# Patient Record
Sex: Female | Born: 1990 | Race: White | Hispanic: No | Marital: Married
Health system: Southern US, Community
[De-identification: ages and names within clinical notes are randomized; demographics above are authoritative.]

---

## 2020-06-26 ENCOUNTER — Ambulatory Visit
Admission: RE | Admit: 2020-06-26 | Discharge: 2020-06-26 | Disposition: A | Discharge status: Home / Self Care | Payer: Self-pay | Source: Ambulatory Visit | Attending: Oncology | Admitting: Oncology

## 2020-06-26 ENCOUNTER — Other Ambulatory Visit: Payer: Self-pay

## 2020-06-26 ENCOUNTER — Encounter (INDEPENDENT_AMBULATORY_CARE_PROVIDER_SITE_OTHER): Payer: Self-pay

## 2020-06-26 ENCOUNTER — Ambulatory Visit: Payer: Self-pay | Attending: Oncology

## 2020-06-26 VITALS — BP 106/81 | HR 85 | Temp 97.1°F | Ht 67.0 in | Wt 171.0 lb

## 2020-06-26 DIAGNOSIS — N644 Mastodynia: Secondary | ICD-10-CM | POA: Insufficient documentation

## 2020-06-26 NOTE — Progress Notes (Signed)
  Subjective:     Patient ID: Chloe Skinner, female   DOB: 28-Dec-1990, 30 y.o.   MRN: [id number]  HPI   Review of Systems     Objective:   Physical Exam Chest:       Comments: Targeted pain right breast 5 oclock 2 cm. From areola!!!       Assessment:    30 year old with complaint of right breast pain since December, presents for [facility] screening.  Denies pain being related to cycle.  States no possibility of being pregnant.    Patient screened, and meets [facility] eligibility.  Patient does not have insurance, Medicare or Medicaid. Instructed patient on breast self awareness using teach back method. Clinical breast exam does not reveal any mass or lump.  Patient states breast feels like it is swollen at times of pain, and reports darkening of areola at that time.    Plan:     Sent for bilateral diagnostic baseline mammogram, and ultrasound.

## 2020-06-26 NOTE — Progress Notes (Signed)
Radiologist reviewed Birads 1 findings with patient .  To return to clinic as needed, ad begin annual mammograms at age 30.  Copy to [facility].

## 2022-06-05 IMAGING — MG DIGITAL DIAGNOSTIC BILAT W/ TOMO W/ CAD
6 of 12 series · 6 of 36 positions shown · non-contrast
Comparison: None.

CLINICAL DATA: 29-year-old female presenting for baseline mammogram
to evaluate focal intermittent right breast pain with a palpable
lump at 5 o'clock. This has been ongoing since Saturday April, 2020.

EXAM:
DIGITAL DIAGNOSTIC BILATERAL MAMMOGRAM WITH TOMOSYNTHESIS AND CAD;
ULTRASOUND RIGHT BREAST LIMITED
TECHNIQUE: Bilateral digital diagnostic mammography and breast tomosynthesis
was performed. The images were evaluated with computer-aided
detection.; Targeted ultrasound examination of the right breast was
performed

[R CC synth-2D]
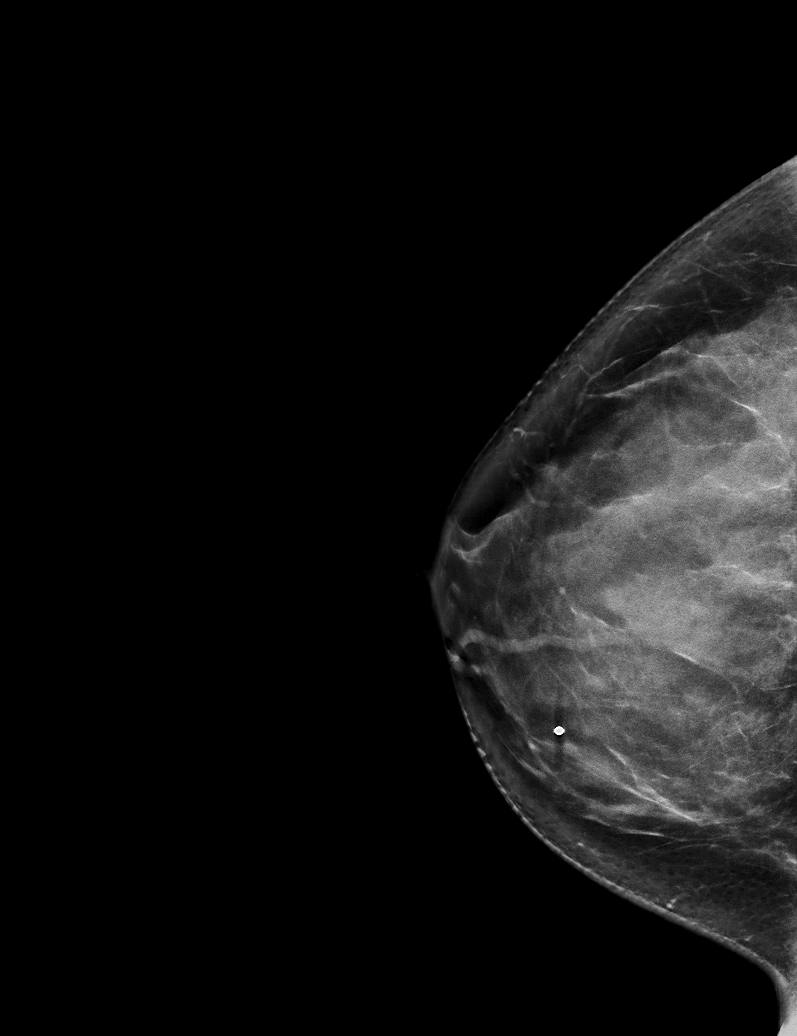

[L CC synth-2D]
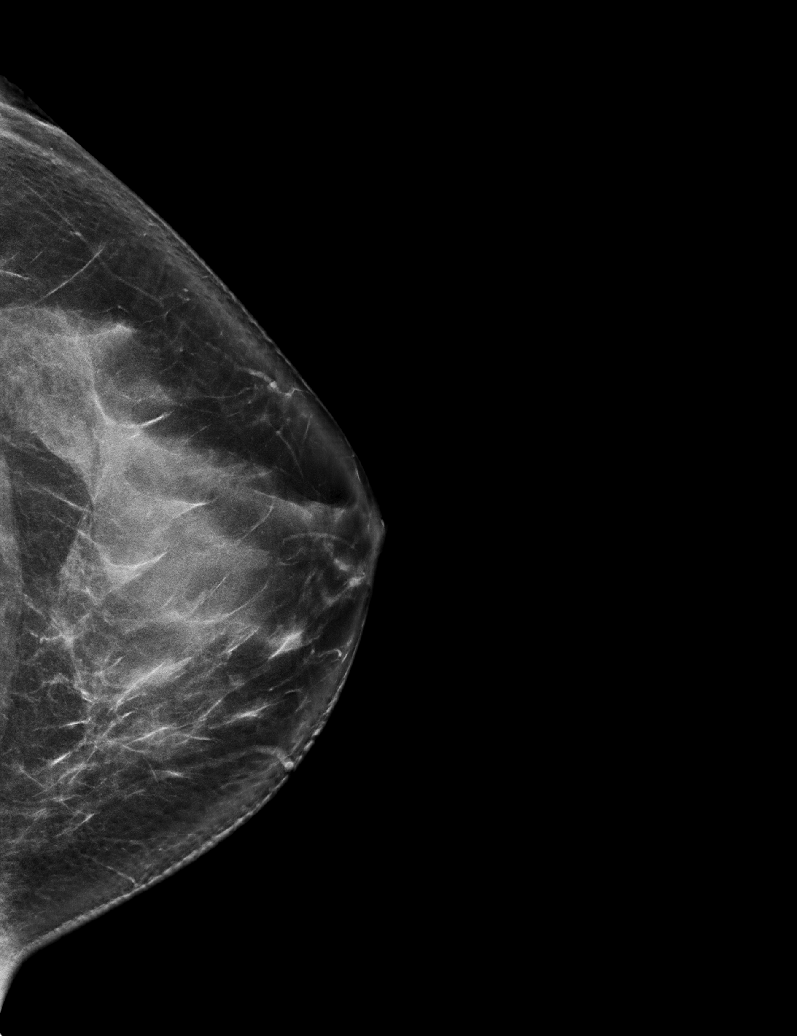

[L MLO synth-2D]
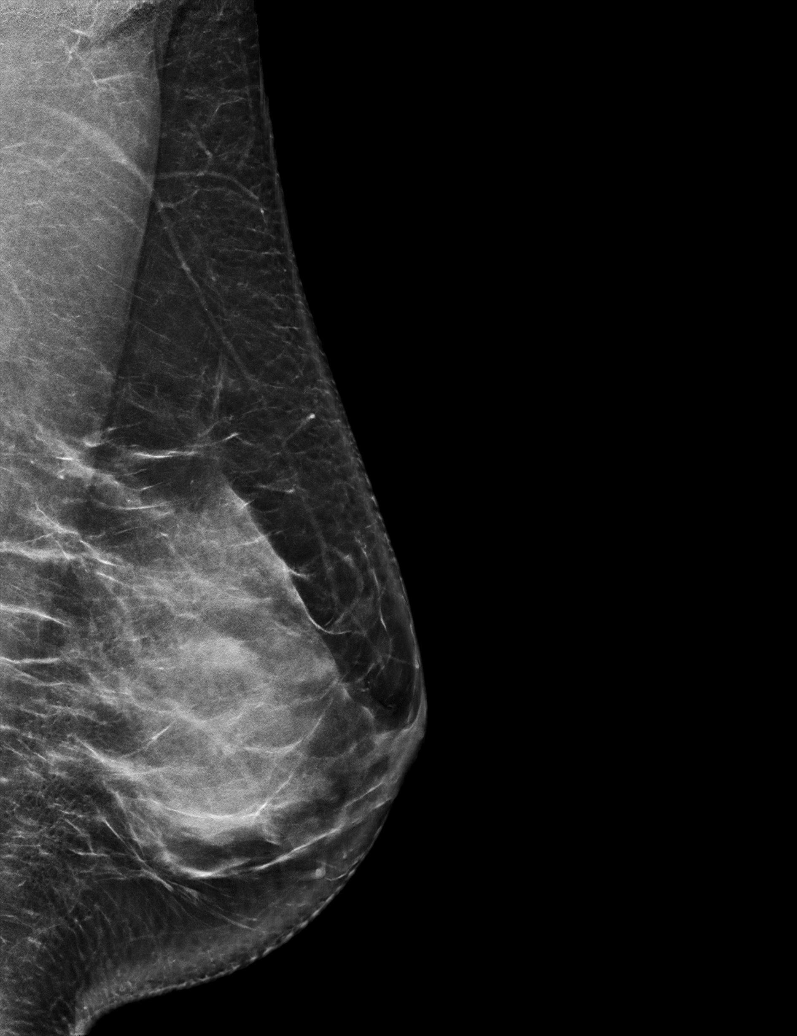

[R MLO synth-2D (1 of 3)]
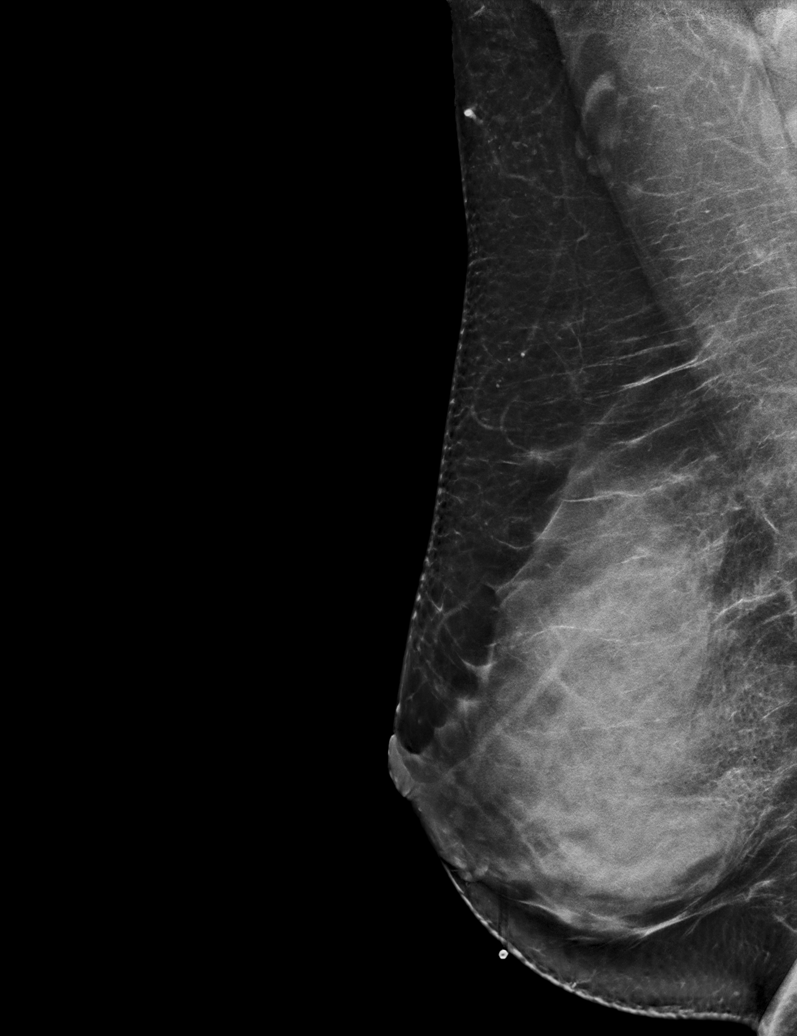

[R MLO synth-2D (2 of 3)]
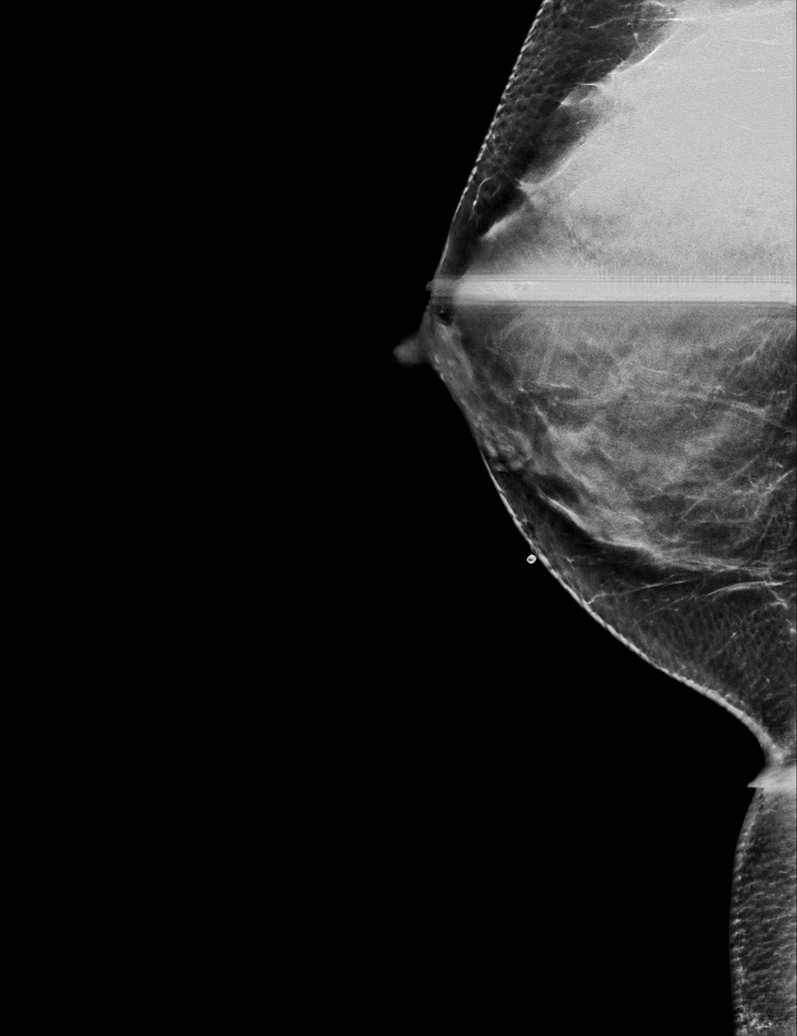

[R MLO synth-2D (3 of 3)]
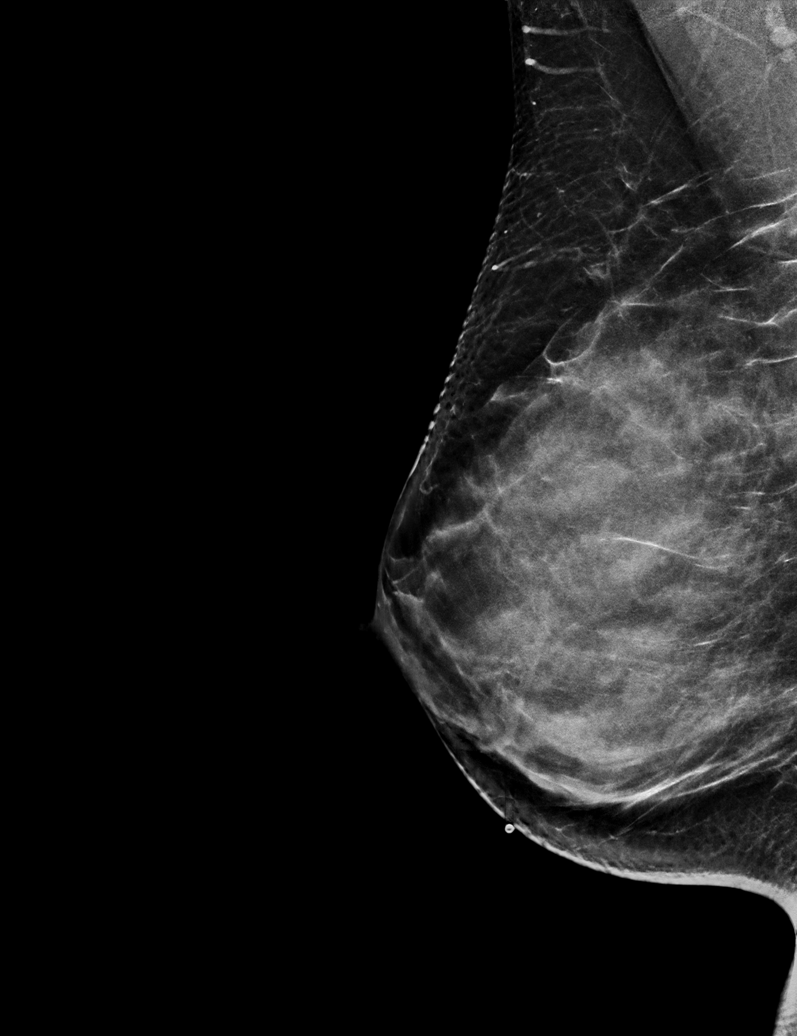

[6 of 36 positions shown; findings below may reference images not displayed]

ACR Breast Density Category d: The breast tissue is extremely dense,
which lowers the sensitivity of mammography.
FINDINGS: A BB has been placed along the inferior aspect of the right breast
indicating the tender palpable site of concern. There are no
suspicious mammographic findings deep to the marker. No suspicious
calcifications, masses or areas of distortion are seen in the
bilateral breasts.

Mammographic images were processed with CAD.

Physical exam of the tender palpable site in the inferior right
breast breast demonstrates no discrete palpable masses.

Ultrasound targeted to the right breast at 5 o'clock, demonstrates
normal fibroglandular tissue. No masses or suspicious areas of
shadowing are identified.
IMPRESSION: 1. There are no suspicious mammographic or targeted sonographic
abnormalities in the inferior right breast at the tender palpable
site of concern.

2.  No mammographic evidence of malignancy in the bilateral breasts.

RECOMMENDATION:
1. Clinical follow-up recommended for the palpable tender area of
concern in the right breast. Any further workup should be based on
clinical grounds.

2. Screening mammogram at age 40 unless there are persistent or
intervening clinical concerns. (Code:I4-0-JTQ)

I have discussed the findings and recommendations with the patient.
If applicable, a reminder letter will be sent to the patient
regarding the next appointment.

BI-RADS CATEGORY  1: Negative.

## 2022-06-05 IMAGING — US US BREAST*R* LIMITED INC AXILLA
1 series · 2 of 2 positions shown · non-contrast
Comparison: None.

CLINICAL DATA: 29-year-old female presenting for baseline mammogram
to evaluate focal intermittent right breast pain with a palpable
lump at 5 o'clock. This has been ongoing since Saturday April, 2020.

EXAM:
DIGITAL DIAGNOSTIC BILATERAL MAMMOGRAM WITH TOMOSYNTHESIS AND CAD;
ULTRASOUND RIGHT BREAST LIMITED
TECHNIQUE: Bilateral digital diagnostic mammography and breast tomosynthesis
was performed. The images were evaluated with computer-aided
detection.; Targeted ultrasound examination of the right breast was
performed

[Series 1: us breast*right* limited inc axilla · 0.06mm/px · 2 of 2 slices shown]
[im 1/2]
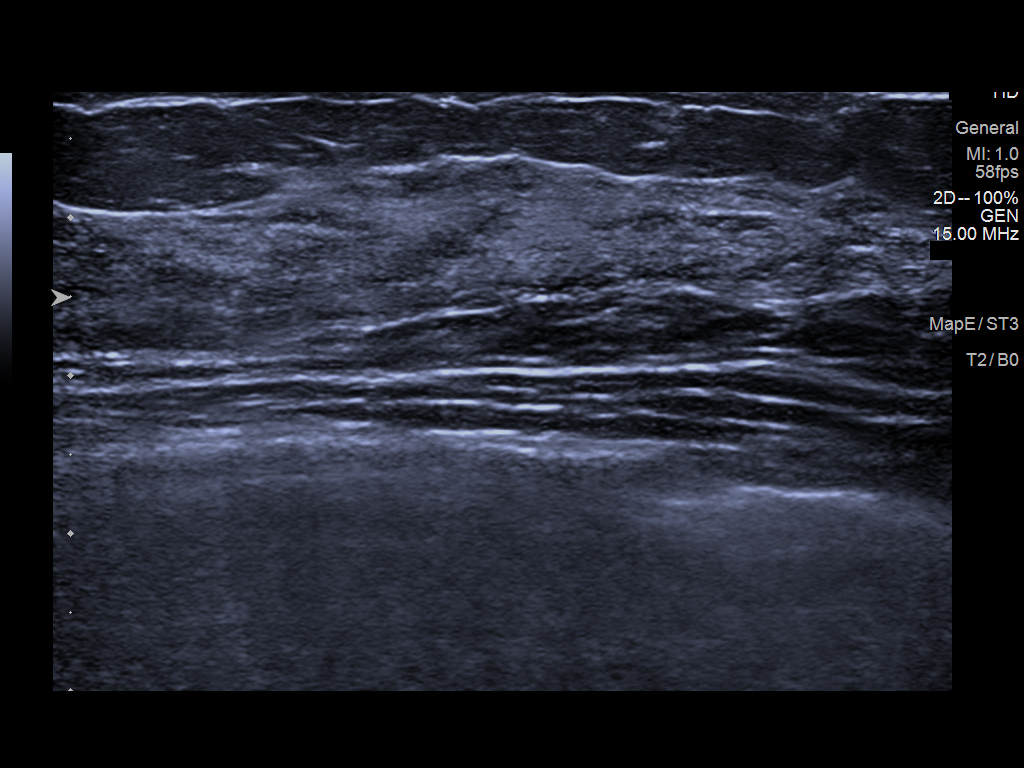
[im 2/2]
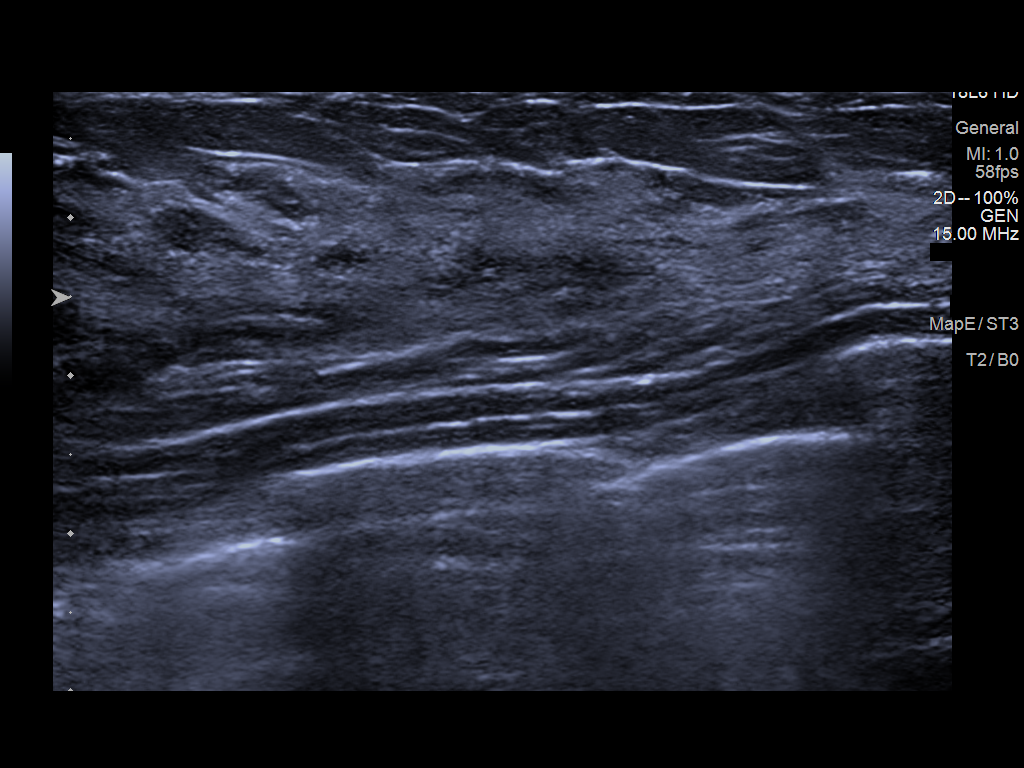

[2 of 2 positions shown; findings below may reference images not displayed]

ACR Breast Density Category d: The breast tissue is extremely dense,
which lowers the sensitivity of mammography.
FINDINGS: A BB has been placed along the inferior aspect of the right breast
indicating the tender palpable site of concern. There are no
suspicious mammographic findings deep to the marker. No suspicious
calcifications, masses or areas of distortion are seen in the
bilateral breasts.

Mammographic images were processed with CAD.

Physical exam of the tender palpable site in the inferior right
breast breast demonstrates no discrete palpable masses.

Ultrasound targeted to the right breast at 5 o'clock, demonstrates
normal fibroglandular tissue. No masses or suspicious areas of
shadowing are identified.
IMPRESSION: 1. There are no suspicious mammographic or targeted sonographic
abnormalities in the inferior right breast at the tender palpable
site of concern.

2.  No mammographic evidence of malignancy in the bilateral breasts.

RECOMMENDATION:
1. Clinical follow-up recommended for the palpable tender area of
concern in the right breast. Any further workup should be based on
clinical grounds.

2. Screening mammogram at age 38 unless there are persistent or
intervening clinical concerns. (Code:I4-0-JTQ)

I have discussed the findings and recommendations with the patient.
If applicable, a reminder letter will be sent to the patient
regarding the next appointment.

BI-RADS CATEGORY  1: Negative.
